# Patient Record
Sex: Female | Born: 1995 | Race: Black or African American | Hispanic: No | Marital: Single | Smoking: Former smoker
Health system: Southern US, Community
[De-identification: ages and names within clinical notes are randomized; demographics above are authoritative.]

## PROBLEM LIST (undated history)

## (undated) DIAGNOSIS — K219 Gastro-esophageal reflux disease without esophagitis: Secondary | ICD-10-CM

## (undated) HISTORY — DX: Gastro-esophageal reflux disease without esophagitis: K21.9

---

## 2018-12-31 ENCOUNTER — Other Ambulatory Visit: Payer: Self-pay

## 2018-12-31 ENCOUNTER — Emergency Department
Admission: EM | Admit: 2018-12-31 | Discharge: 2018-12-31 | Disposition: A | Discharge status: Home / Self Care | Payer: Self-pay | Attending: Emergency Medicine | Admitting: Emergency Medicine

## 2018-12-31 ENCOUNTER — Encounter: Payer: Self-pay | Admitting: Emergency Medicine

## 2018-12-31 DIAGNOSIS — J069 Acute upper respiratory infection, unspecified: Secondary | ICD-10-CM | POA: Insufficient documentation

## 2018-12-31 DIAGNOSIS — B9789 Other viral agents as the cause of diseases classified elsewhere: Secondary | ICD-10-CM | POA: Insufficient documentation

## 2018-12-31 LAB — INFLUENZA PANEL BY PCR (TYPE A & B)
INFLAPCR: NEGATIVE
Influenza B By PCR: NEGATIVE

## 2018-12-31 LAB — GROUP A STREP BY PCR: Group A Strep by PCR: NOT DETECTED

## 2018-12-31 MED ORDER — PROMETHAZINE-DM 6.25-15 MG/5ML PO SYRP: 5.0000 mL | ORAL_SOLUTION | Freq: Four times a day (QID) | ORAL | 0 refills | Status: DC | PRN

## 2018-12-31 MED ORDER — IBUPROFEN 600 MG PO TABS: 600.0000 mg | ORAL_TABLET | Freq: Three times a day (TID) | ORAL | 0 refills | Status: DC | PRN

## 2018-12-31 NOTE — ED Provider Notes (Signed)
[facility] Emergency Department Provider Note   ____________________________________________   First MD Initiated Contact with Patient 12/31/18 1541     (approximate)  I have reviewed the triage vital signs and the nursing notes.   HISTORY  Chief Complaint Sore Throat    HPI Crystal Vincent is a 23 y.o. female patient reports to ED for complaint of sore throat, fever, and body aches for 2 days.  Patient state she had vomiting and diarrhea on day 1.  Patient did increase decrease yesterday and none today.  Patient states is able tolerate food and fluids.  Patient states he has not taken flu shot for this season.  Patient rates the pain as a 6/10.  Patient describes her pain as "sore throat//body aches".  No palliative measure for complaint.    History reviewed. No pertinent past medical history.  There are no active problems to display for this patient.   History reviewed. No pertinent surgical history.  Prior to Admission medications   Medication Sig Start Date End Date Taking? Authorizing Provider  ibuprofen (ADVIL,MOTRIN) 600 MG tablet Take 1 tablet (600 mg total) by mouth every 8 (eight) hours as needed. 12/31/18   Joni Reining, PA-C  promethazine-dextromethorphan (PROMETHAZINE-DM) 6.25-15 MG/5ML syrup Take 5 mLs by mouth 4 (four) times daily as needed for cough. 12/31/18   Joni Reining, PA-C    Allergies Patient has no known allergies.  No family history on file.  Social History Social History   Tobacco Use  . Smoking status: Not on file  Substance Use Topics  . Alcohol use: Not on file  . Drug use: Not on file    Review of Systems Constitutional: Fever/chills and body aches.   Eyes: No visual changes. ENT: Sore throat.   Cardiovascular: Denies chest pain. Respiratory: Denies shortness of breath.  Nonproductive cough. Gastrointestinal: No abdominal pain.  No nausea, no vomiting.  No diarrhea.  No constipation. Genitourinary:  Negative for dysuria. Musculoskeletal: Negative for back pain. Skin: Negative for rash. Neurological: Negative for headaches, focal weakness or numbness.   ____________________________________________   PHYSICAL EXAM:  VITAL SIGNS: ED Triage Vitals  Enc Vitals Group     BP 12/31/18 1530 127/74     Pulse Rate 12/31/18 1530 70     Resp 12/31/18 1530 18     Temp 12/31/18 1530 98.6 F (37 C)     Temp Source 12/31/18 1530 Oral     SpO2 12/31/18 1530 98 %     Weight 12/31/18 1533 280 lb (127 kg)     Height 12/31/18 1533 5\' 4"  (1.626 m)     Head Circumference --      Peak Flow --      Pain Score 12/31/18 1533 6     Pain Loc --      Pain Edu? --      Excl. in GC? --     Constitutional: Alert and oriented. Well appearing and in no acute distress. Nose: Clear rhinorrhea.   Mouth/Throat: Mucous membranes are moist.  Oropharynx non-erythematous.  Postnasal drainage. Neck: No stridor.   Hematological/Lymphatic/Immunilogical: No cervical lymphadenopathy. Cardiovascular: Normal rate, regular rhythm. Grossly normal heart sounds.  Good peripheral circulation. Respiratory: Normal respiratory effort.  No retractions. Lungs CTAB. Skin:  Skin is warm, dry and intact. No rash noted.  ____________________________________________   LABS (all labs ordered are listed, but only abnormal results are displayed)  Labs Reviewed  GROUP A STREP BY PCR  INFLUENZA PANEL BY  PCR (TYPE A & B)   ____________________________________________  EKG   ____________________________________________  RADIOLOGY  ED MD interpretation:    Official radiology report(s): No results found.  ____________________________________________   PROCEDURES  Procedure(s) performed (including Critical Care):  Procedures   ____________________________________________   INITIAL IMPRESSION / ASSESSMENT AND PLAN / ED COURSE  As part of my medical decision making, I reviewed the following data within the  electronic MEDICAL RECORD NUMBER      Patient presents with sore throat and fever for 2 to 3 days.  Patient had not taken flu shot for this season.  Patient strep and flu results were negative.  Patient given discharge care instructions for viral respiratory infection.  Patient advised [facility]-up [facility].         ____________________________________________   FINAL CLINICAL IMPRESSION(S) / ED DIAGNOSES  Final diagnoses:  Viral URI with cough     ED Discharge Orders         Ordered    promethazine-dextromethorphan (PROMETHAZINE-DM) 6.25-15 MG/5ML syrup  4 times daily PRN     12/31/18 1636    ibuprofen (ADVIL,MOTRIN) 600 MG tablet  Every 8 hours PRN     12/31/18 1636           Note:  This document was prepared using Dragon voice recognition software and may include unintentional dictation errors.    Joni Reining, PA-C 12/31/18 1642    Phineas Semen, MD 01/01/19 1339

## 2018-12-31 NOTE — ED Triage Notes (Signed)
Pt in via POV, reports sore throat and fever x 3 days.  Vitals WDL, NAD noted at this time.

## 2020-10-26 ENCOUNTER — Ambulatory Visit
Admission: EM | Admit: 2020-10-26 | Discharge: 2020-10-26 | Disposition: A | Discharge status: Home / Self Care | Payer: BC Managed Care – PPO

## 2020-10-26 DIAGNOSIS — R1011 Right upper quadrant pain: Secondary | ICD-10-CM

## 2020-10-26 NOTE — ED Provider Notes (Signed)
[facility]    CSN: [id number] Arrival date & time: 10/26/20  1213      History   Chief Complaint Chief Complaint  Patient presents with  . Abdominal Pain    [phone]    HPI Crystal Vincent is a 25 y.o. female.   HPI   25 year old female here for evaluation of right upper quadrant abdominal pain.  Patient reports that she has been having pain off and on at random times for the last 3 to 4 months.  Patient states that she cannot determine if eating makes it better or worse.  She does state that it radiates through to her back and is associated with nausea.  Patient denies fever, vomiting, or urinary complaints.    History reviewed. No pertinent past medical history.  There are no problems to display for this patient.   History reviewed. No pertinent surgical history.  OB History   No obstetric history on file.      Home Medications    Prior to Admission medications   Medication Sig Start Date End Date Taking? Authorizing Provider  ibuprofen (ADVIL,MOTRIN) 600 MG tablet Take 1 tablet (600 mg total) by mouth every 8 (eight) hours as needed. 12/31/18   Joni Reining, PA-C  promethazine-dextromethorphan (PROMETHAZINE-DM) 6.25-15 MG/5ML syrup Take 5 mLs by mouth 4 (four) times daily as needed for cough. 12/31/18   Joni Reining, PA-C    Family History Family History  Problem Relation Age of Onset  . Heart disease Mother   . Healthy Father     Social History Social History   Tobacco Use  . Smoking status: Current Every Day Smoker    Packs/day: 0.50    Types: Cigarettes  . Smokeless tobacco: Never Used  Vaping Use  . Vaping Use: Every day  Substance Use Topics  . Alcohol use: Not Currently  . Drug use: Never     Allergies   Patient has no known allergies.   Review of Systems Review of Systems  Constitutional: Negative for activity change, appetite change and fever.  Gastrointestinal: Positive for abdominal pain and nausea.  Negative for vomiting.  Genitourinary: Negative for dysuria, frequency and urgency.  Hematological: Negative.   Psychiatric/Behavioral: Negative.      Physical Exam Triage Vital Signs ED Triage Vitals  Enc Vitals Group     BP 10/26/20 1459 104/60     Pulse Rate 10/26/20 1459 91     Resp 10/26/20 1459 18     Temp 10/26/20 1459 98.2 F (36.8 C)     Temp Source 10/26/20 1459 Oral     SpO2 10/26/20 1459 100 %     Weight 10/26/20 1334 (!) 320 lb (145.2 kg)     Height 10/26/20 1334 5\' 3"  (1.6 m)     Head Circumference --      Peak Flow --      Pain Score 10/26/20 1333 7     Pain Loc --      Pain Edu? --      Excl. in GC? --    No data found.  Updated Vital Signs BP 104/60 (BP Location: Right Arm)   Pulse 91   Temp 98.2 F (36.8 C) (Oral)   Resp 18   Ht 5\' 3"  (1.6 m)   Wt (!) 320 lb (145.2 kg)   SpO2 100%   BMI 56.69 kg/m   Visual Acuity Right Eye Distance:   Left Eye Distance:   Bilateral Distance:  Right Eye Near:   Left Eye Near:    Bilateral Near:     Physical Exam Vitals and nursing note reviewed.  Constitutional:      Appearance: She is well-developed. She is obese.  HENT:     Head: Normocephalic and atraumatic.  Cardiovascular:     Rate and Rhythm: Normal rate and regular rhythm.     Heart sounds: Normal heart sounds. No murmur heard. No gallop.   Pulmonary:     Effort: Pulmonary effort is normal.     Breath sounds: Normal breath sounds. No wheezing, rhonchi or rales.  Abdominal:     General: Abdomen is protuberant. Bowel sounds are normal. There is no distension.     Palpations: Abdomen is soft. There is no hepatomegaly or splenomegaly.     Tenderness: There is abdominal tenderness in the right upper quadrant. There is guarding. Positive signs include Murphy's sign.  Skin:    General: Skin is warm and dry.     Capillary Refill: Capillary refill takes less than 2 seconds.     Findings: No erythema.  Neurological:     General: No focal deficit  present.     Mental Status: She is alert and oriented to person, place, and time.  Psychiatric:        Mood and Affect: Mood normal.        Behavior: Behavior normal.      UC Treatments / Results  Labs (all labs ordered are listed, but only abnormal results are displayed) Labs Reviewed - No data to display  EKG   Radiology No results found.  Procedures Procedures (including critical care time)  Medications Ordered in UC Medications - No data to display  Initial Impression / Assessment and Plan / UC Course  I have reviewed the triage vital signs and the nursing notes.  Pertinent labs & imaging results that were available during my care of the patient were reviewed by me and considered in my medical decision making (see chart for details).   Evaluation of right upper quadrant abdominal pain that is been present for last 3 to 4 months.  Patient states that she is unsure of eating makes it worse.  The pain does radiate through to her back and she does complain of nausea.  Physical exam reveals marked right upper quadrant tenderness with guarding.  Patient also has a positive Murphy's sign on exam.  Patient just ate [employer] before coming to the urgent care and is currently rating her pain as a 6 out of 10 and constant.  Patient's physical exam and presentation is concerning consistent with an worrisome for gallbladder disease.  In the setting of morbid obesity and the fact that she is having constant pain after eating [employer], concerned that she may have acute cholecystitis.  Patient counseled that I am concerned that she does have her gallbladder removed patient to be evaluated in the emergency department.  Patient is in agreement and has elected to go to [facility].   Final Clinical Impressions(s) / UC Diagnoses   Final diagnoses:  RUQ abdominal pain     Discharge Instructions     Please go to the emergency department at [facility] for evaluation of your  gallbladder.    ED Prescriptions    None     PDMP not reviewed this encounter.   Becky Augusta, NP 10/26/20 [phone]

## 2020-10-26 NOTE — ED Triage Notes (Signed)
Patient complains of RUQ pain that started around 3-4 months ago. States that she has been having pain off and on and shows up randomly.

## 2020-10-26 NOTE — Discharge Instructions (Addendum)
Please go to the emergency department at [facility] for evaluation of your gallbladder.

## 2020-11-14 ENCOUNTER — Ambulatory Visit (INDEPENDENT_AMBULATORY_CARE_PROVIDER_SITE_OTHER): Payer: BC Managed Care – PPO

## 2020-11-14 ENCOUNTER — Other Ambulatory Visit: Payer: Self-pay

## 2020-11-14 ENCOUNTER — Ambulatory Visit
Admission: EM | Admit: 2020-11-14 | Discharge: 2020-11-14 | Disposition: A | Discharge status: Home / Self Care | Payer: BC Managed Care – PPO | Attending: Family Medicine | Admitting: Family Medicine

## 2020-11-14 DIAGNOSIS — R058 Other specified cough: Secondary | ICD-10-CM

## 2020-11-14 DIAGNOSIS — F17210 Nicotine dependence, cigarettes, uncomplicated: Secondary | ICD-10-CM | POA: Insufficient documentation

## 2020-11-14 DIAGNOSIS — U071 COVID-19: Secondary | ICD-10-CM | POA: Diagnosis not present

## 2020-11-14 DIAGNOSIS — J988 Other specified respiratory disorders: Secondary | ICD-10-CM | POA: Diagnosis present

## 2020-11-14 DIAGNOSIS — R0981 Nasal congestion: Secondary | ICD-10-CM

## 2020-11-14 MED ORDER — IPRATROPIUM BROMIDE 0.06 % NA SOLN: 2.0000 | Freq: Four times a day (QID) | NASAL | 0 refills | Status: DC | PRN

## 2020-11-14 MED ORDER — AMOXICILLIN-POT CLAVULANATE 875-125 MG PO TABS: 1.0000 | ORAL_TABLET | Freq: Two times a day (BID) | ORAL | 0 refills | Status: DC

## 2020-11-14 MED ORDER — BENZONATATE 200 MG PO CAPS: 200.0000 mg | ORAL_CAPSULE | Freq: Three times a day (TID) | ORAL | 0 refills | Status: DC | PRN

## 2020-11-14 NOTE — Discharge Instructions (Signed)
Medication as prescribed.  Stay home.  Check my chart for COVID test results.  Take care  Dr. Yitta Gongaware   

## 2020-11-14 NOTE — ED Triage Notes (Signed)
Pt presents with c/o productive cough and nasal congestion since last week. She also reports some shob. Pt denies n/v/d/f. Pt has taken Tylenol and nasal spray with some relief. Pt denies any known sick contacts.

## 2020-11-14 NOTE — ED Provider Notes (Signed)
[facility]    CSN: [id number] Arrival date & time: 11/14/20  1359      History   Chief Complaint Chief Complaint  Patient presents with  . Cough  . Nasal Congestion    HPI  25 year old female presents with respiratory symptoms.  Patient reports that she has been sick for over a week.  She reports sore throat, congestion, dizziness, cough.  She states that she initially had fever.  Has not been tested for Covid.  No reported sick contacts.  She has taken over-the-counter medication with some relief but no resolution.  No other complaints or concerns at this time.  Home Medications    Prior to Admission medications   Medication Sig Start Date End Date Taking? Authorizing Provider  amoxicillin-clavulanate (AUGMENTIN) 875-125 MG tablet Take 1 tablet by mouth 2 (two) times daily. 11/14/20  Yes Akasha Melena G, DO  benzonatate (TESSALON) 200 MG capsule Take 1 capsule (200 mg total) by mouth 3 (three) times daily as needed for cough. 11/14/20  Yes Monet North G, DO  ipratropium (ATROVENT) 0.06 % nasal spray Place 2 sprays into both nostrils 4 (four) times daily as needed for rhinitis. 11/14/20  Yes Loel Betancur G, DO  omeprazole (PRILOSEC) 20 MG capsule Take 20 mg by mouth daily. 11/01/20   [provider]    Family History Family History  Problem Relation Age of Onset  . Heart disease Mother   . Healthy Father     Social History Social History   Tobacco Use  . Smoking status: Current Every Day Smoker    Packs/day: 0.50    Types: Cigarettes  . Smokeless tobacco: Never Used  Vaping Use  . Vaping Use: Every day  Substance Use Topics  . Alcohol use: Not Currently  . Drug use: Never     Allergies   Patient has no known allergies.   Review of Systems Review of Systems Per HPI  Physical Exam Triage Vital Signs ED Triage Vitals  Enc Vitals Group     BP 11/14/20 1549 (!) 120/40     Pulse Rate 11/14/20 1549 86     Resp 11/14/20 1549 18     Temp  11/14/20 1549 97.7 F (36.5 C)     Temp Source 11/14/20 1549 Oral     SpO2 11/14/20 1549 99 %     Weight 11/14/20 1545 300 lb (136.1 kg)     Height 11/14/20 1545 5\' 4"  (1.626 m)     Head Circumference --      Peak Flow --      Pain Score 11/14/20 1545 0     Pain Loc --      Pain Edu? --      Excl. in GC? --    Updated Vital Signs BP (!) 120/40 (BP Location: Left Arm)   Pulse 86   Temp 97.7 F (36.5 C) (Oral)   Resp 18   Ht 5\' 4"  (1.626 m)   Wt 136.1 kg   SpO2 99%   BMI 51.49 kg/m   Visual Acuity Right Eye Distance:   Left Eye Distance:   Bilateral Distance:    Right Eye Near:   Left Eye Near:    Bilateral Near:     Physical Exam Vitals and nursing note reviewed.  Constitutional:      General: She is not in acute distress.    Appearance: Normal appearance. She is obese. She is not ill-appearing.  HENT:  Head: Normocephalic and atraumatic.     Nose: Congestion present.     Mouth/Throat:     Pharynx: Oropharynx is clear.  Eyes:     General:        Right eye: No discharge.        Left eye: No discharge.     Conjunctiva/sclera: Conjunctivae normal.  Cardiovascular:     Rate and Rhythm: Normal rate and regular rhythm.     Heart sounds: No murmur heard.   Pulmonary:     Effort: Pulmonary effort is normal.     Breath sounds: Normal breath sounds. No wheezing, rhonchi or rales.  Neurological:     Mental Status: She is alert.  Psychiatric:        Mood and Affect: Mood normal.        Behavior: Behavior normal.    UC Treatments / Results  Labs (all labs ordered are listed, but only abnormal results are displayed) Labs Reviewed  SARS CORONAVIRUS 2 (TAT 6-24 HRS)    EKG   Radiology DG Chest 2 View  Result Date: 11/14/2020 CLINICAL DATA:  Productive cough, nasal congestion EXAM: CHEST - 2 VIEW COMPARISON:  None. FINDINGS: The heart size and mediastinal contours are within normal limits. Both lungs are clear. The visualized skeletal structures are  unremarkable. IMPRESSION: No active cardiopulmonary disease. Electronically Signed   By: Sharlet Salina M.D.   On: 11/14/2020 16:42    Procedures Procedures (including critical care time)  Medications Ordered in UC Medications - No data to display  Initial Impression / Assessment and Plan / UC Course  I have reviewed the triage vital signs and the nursing notes.  Pertinent labs & imaging results that were available during my care of the patient were reviewed by me and considered in my medical decision making (see chart for details).    25 year old female presents with a respiratory infection.  Awaiting Covid test results.  Given the duration of her symptoms, placing on Augmentin.  Tessalon Perles for cough.  Supportive care.  Final Clinical Impressions(s) / UC Diagnoses   Final diagnoses:  Respiratory infection     Discharge Instructions     Medication as prescribed.  Stay home.  Check my chart for COVID test results.  Take care  Dr. Adriana Simas     ED Prescriptions    Medication Sig Dispense Auth. Provider   amoxicillin-clavulanate (AUGMENTIN) 875-125 MG tablet Take 1 tablet by mouth 2 (two) times daily. 20 tablet Loic Hobin G, DO   benzonatate (TESSALON) 200 MG capsule Take 1 capsule (200 mg total) by mouth 3 (three) times daily as needed for cough. 30 capsule Idriss Quackenbush G, DO   ipratropium (ATROVENT) 0.06 % nasal spray Place 2 sprays into both nostrils 4 (four) times daily as needed for rhinitis. 15 mL Tommie Sams, DO     PDMP not reviewed this encounter.   Tommie Sams, [place] 11/14/20 1829

## 2020-11-15 LAB — SARS CORONAVIRUS 2 (TAT 6-24 HRS): SARS Coronavirus 2: POSITIVE — AB

## 2022-02-16 IMAGING — CR DG CHEST 2V
2 series · 3 of 3 positions shown · non-contrast
Comparison: None.

CLINICAL DATA: Productive cough, nasal congestion

EXAM:
CHEST - 2 VIEW

[Series 1: chest pa · 0.14mm/px · 2 of 2 slices shown]
[im 1/2]
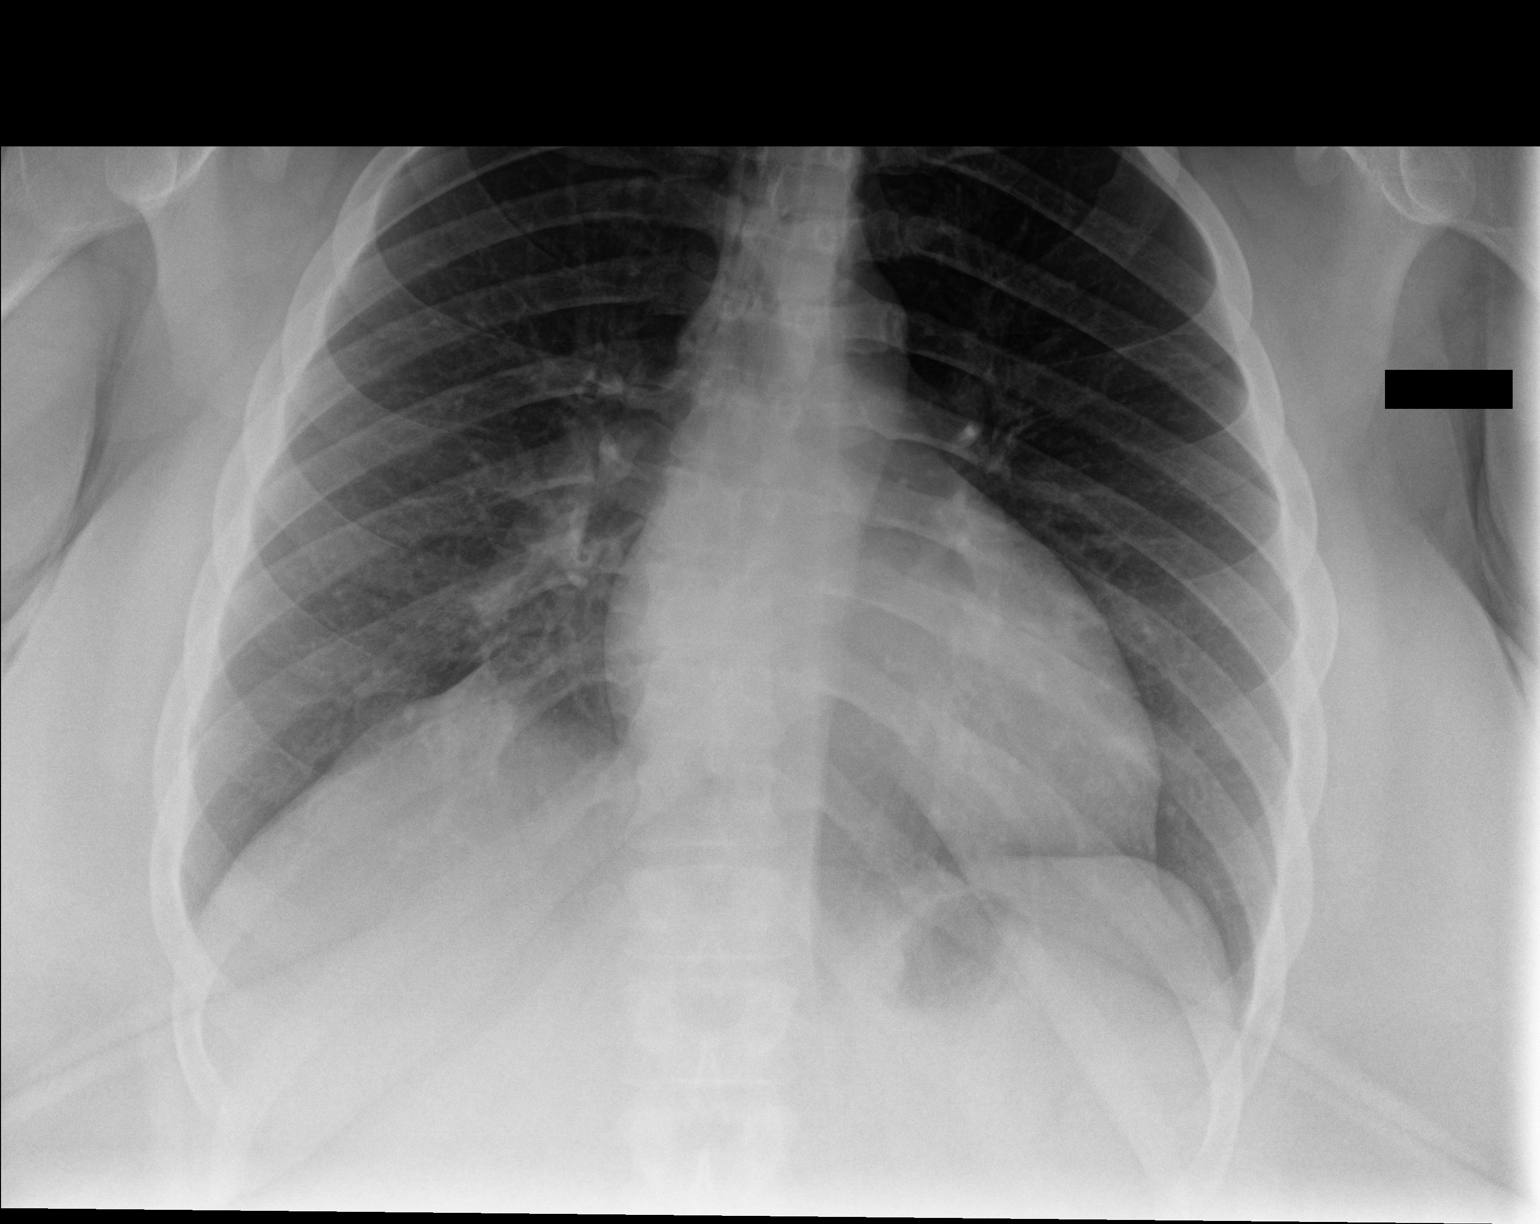
[im 2/2]
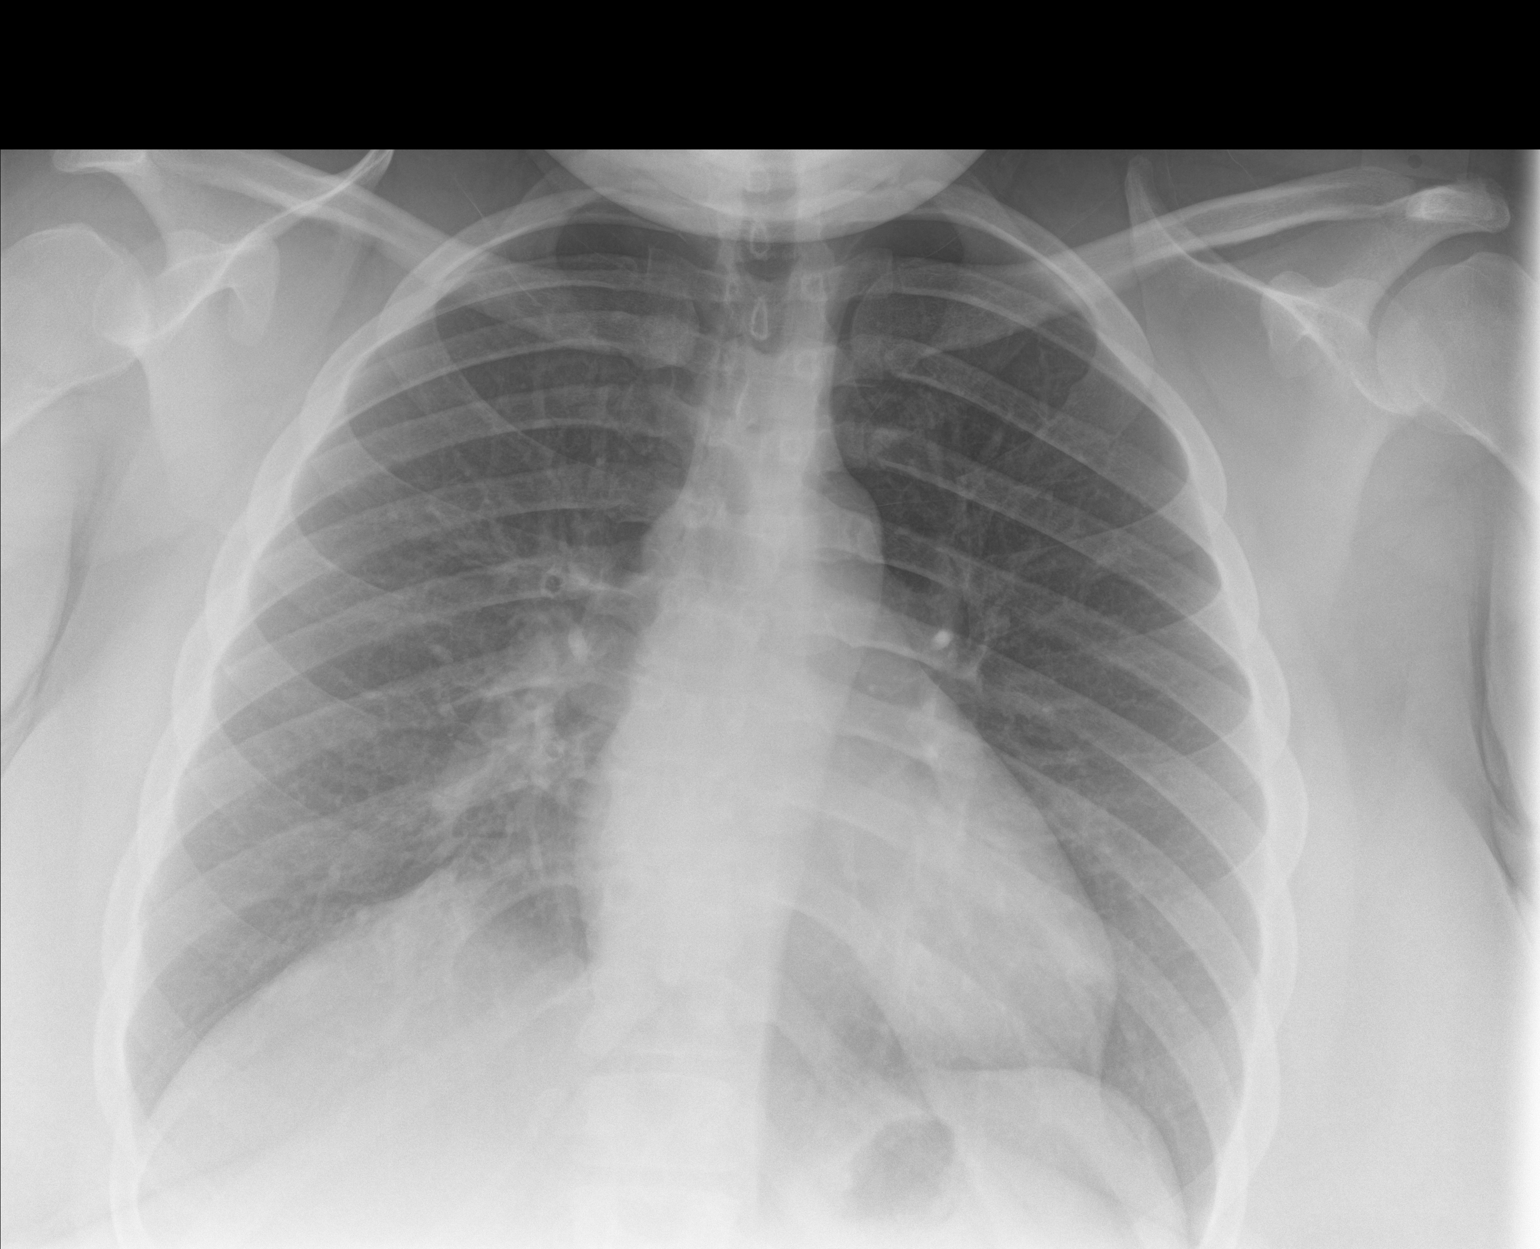

[chest lat]
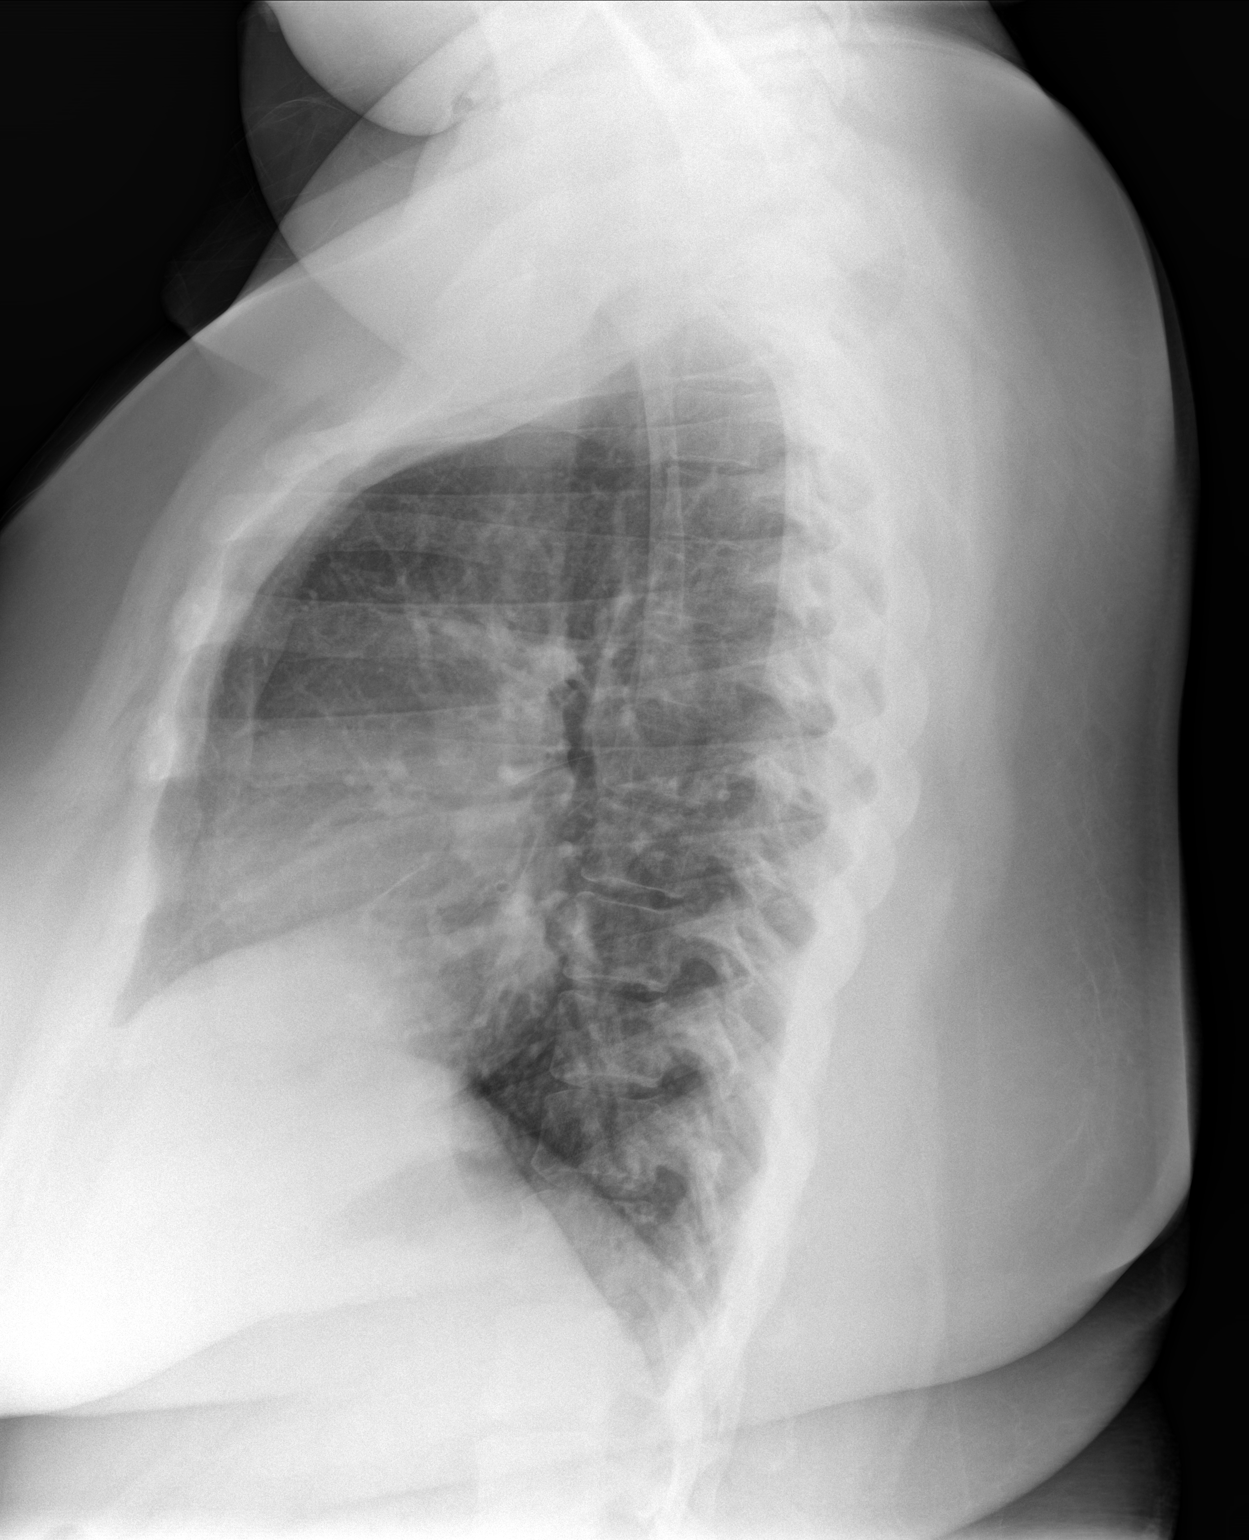

[3 of 3 positions shown; findings below may reference images not displayed]

FINDINGS: The heart size and mediastinal contours are within normal limits.
Both lungs are clear. The visualized skeletal structures are
unremarkable.
IMPRESSION: No active cardiopulmonary disease.

## 2022-08-29 ENCOUNTER — Other Ambulatory Visit: Payer: Self-pay

## 2022-08-29 ENCOUNTER — Emergency Department (HOSPITAL_COMMUNITY)
Admission: EM | Admit: 2022-08-29 | Discharge: 2022-08-29 | Discharge status: Against Medical Advice | Payer: BC Managed Care – PPO | Attending: Emergency Medicine | Admitting: Emergency Medicine

## 2022-08-29 DIAGNOSIS — R109 Unspecified abdominal pain: Secondary | ICD-10-CM | POA: Insufficient documentation

## 2022-08-29 DIAGNOSIS — R112 Nausea with vomiting, unspecified: Secondary | ICD-10-CM | POA: Diagnosis present

## 2022-08-29 DIAGNOSIS — Z5321 Procedure and treatment not carried out due to patient leaving prior to being seen by health care provider: Secondary | ICD-10-CM | POA: Insufficient documentation

## 2022-08-29 NOTE — ED Triage Notes (Signed)
Pt. Stated, I started to have stomach pain with N/V last night.

## 2024-06-17 NOTE — Patient Instructions (Signed)

## 2024-06-18 ENCOUNTER — Ambulatory Visit

## 2024-06-19 ENCOUNTER — Ambulatory Visit (INDEPENDENT_AMBULATORY_CARE_PROVIDER_SITE_OTHER): Admitting: Nurse Practitioner

## 2024-06-19 ENCOUNTER — Encounter: Payer: Self-pay | Admitting: Nurse Practitioner

## 2024-06-19 VITALS — BP 102/66 | HR 58 | Temp 98.7°F | Ht 62.4 in | Wt 337.6 lb

## 2024-06-19 DIAGNOSIS — N926 Irregular menstruation, unspecified: Secondary | ICD-10-CM | POA: Diagnosis not present

## 2024-06-19 DIAGNOSIS — Z1159 Encounter for screening for other viral diseases: Secondary | ICD-10-CM

## 2024-06-19 DIAGNOSIS — Z114 Encounter for screening for human immunodeficiency virus [HIV]: Secondary | ICD-10-CM

## 2024-06-19 DIAGNOSIS — Z136 Encounter for screening for cardiovascular disorders: Secondary | ICD-10-CM

## 2024-06-19 DIAGNOSIS — E66813 Obesity, class 3: Secondary | ICD-10-CM

## 2024-06-19 DIAGNOSIS — F321 Major depressive disorder, single episode, moderate: Secondary | ICD-10-CM | POA: Diagnosis not present

## 2024-06-19 DIAGNOSIS — Z6841 Body Mass Index (BMI) 40.0 and over, adult: Secondary | ICD-10-CM

## 2024-06-19 DIAGNOSIS — Z13220 Encounter for screening for lipoid disorders: Secondary | ICD-10-CM

## 2024-06-19 DIAGNOSIS — Z7689 Persons encountering health services in other specified circumstances: Secondary | ICD-10-CM

## 2024-06-19 DIAGNOSIS — F32A Depression, unspecified: Secondary | ICD-10-CM | POA: Insufficient documentation

## 2024-06-19 DIAGNOSIS — E559 Vitamin D deficiency, unspecified: Secondary | ICD-10-CM

## 2024-06-19 MED ORDER — BUPROPION HCL ER (XL) 150 MG PO TB24: 150.0000 mg | ORAL_TABLET | Freq: Every day | ORAL | 1 refills | Status: DC

## 2024-06-19 NOTE — Progress Notes (Addendum)
 New Patient Office Visit  Subjective    Patient ID: Crystal Vincent, female    DOB: 1996/05/16  Age: 28 y.o. MRN: [id number]  CC:  Chief Complaint  Patient presents with   Obesity    Patient states she would like to discuss weight loss   HPI [name] presents for new patient visit to establish care.  Introduced to Publishing rights manager role and practice setting.  All questions answered.  Discussed provider/patient relationship and expectations. Has never had a primary care provider. Works at dialysis, [employer].  ABNORMAL MENSTRUAL PERIODS No history of pregnancy. Duration: months Average interval between menses: sometimes 6 months to 2 years Length of menses: 2-3 days Flow: light Dysmenorrhea: yes  Intermenstrual bleeding:no Postcoital bleeding: no Contraception: none Menarche at age: 28 years old Sexual activity: Not sexually active History of sexually transmitted diseases: no History GYN procedures: no Abnormal pap smears: has not had one yet   Dyspareunia: no Vaginal discharge:no Abdominal pain: no Galactorrhea: no Hirsuitism: yes Frequent bruising/mucosal bleeding: yes Double vision:no Hot flashes: yes   WEIGHT GAIN Has not taken anything in past for weight loss.  Does work on diet and exercise, has active job and has not budged in weight.  Tries not to eat fast food, but eats 3 times a week.   Duration: chronic Previous attempts at weight loss: yes Complications of obesity: GERD and irregular cycles Peak weight: 350 lbs Weight loss goal: 190 lbs Weight loss to date: 13 lbs Requesting obesity pharmacotherapy: yes Current weight loss supplements/medications: no Previous weight loss supplements/meds: no Calories:  2200 calories or less, when working does not have as much time to eat  DEPRESSION Has been depressed for long long time. Has history of some traumatic event in past.   Mood status: uncontrolled Psychotherapy/counseling: tried in the  past Previous psychiatric medications: none Depressed mood: yes Anxious mood: yes Anhedonia: occasional Significant weight loss or gain: no Insomnia: yes hard to stay asleep Fatigue: yes Feelings of worthlessness or guilt: yes Impaired concentration/indecisiveness: yes Suicidal ideations: no -- occasional fleeting thoughts, but no plans Hopelessness: yes Crying spells: yes    06/19/2024   10:57 AM  Depression screen PHQ 2/9  Decreased Interest 1  Down, Depressed, Hopeless 2  PHQ - 2 Score 3  Altered sleeping 2  Tired, decreased energy 2  Change in appetite 2  Feeling bad or failure about yourself  2  Trouble concentrating 3  Moving slowly or fidgety/restless 0  Suicidal thoughts 1  PHQ-9 Score 15  Difficult doing work/chores Very difficult       06/19/2024   10:57 AM  GAD 7 : Generalized Anxiety Score  Nervous, Anxious, on Edge 2  Control/stop worrying 2  Worry too much - different things 3  Trouble relaxing 2  Restless 1  Easily annoyed or irritable 3  Afraid - awful might happen 1  Total GAD 7 Score 14  Anxiety Difficulty Somewhat difficult   Outpatient Encounter Medications as of 06/19/2024  Medication Sig   buPROPion  (WELLBUTRIN  XL) 150 MG 24 hr tablet Take 1 tablet (150 mg total) by mouth daily.   [DISCONTINUED] amoxicillin  (AMOXIL ) 500 MG capsule Take 500 mg by mouth 3 (three) times daily.   [DISCONTINUED] amoxicillin -clavulanate (AUGMENTIN ) 875-125 MG tablet Take 1 tablet by mouth 2 (two) times daily.   [DISCONTINUED] benzonatate  (TESSALON ) 200 MG capsule Take 1 capsule (200 mg total) by mouth 3 (three) times daily as needed for cough.   [DISCONTINUED] ipratropium (ATROVENT ) 0.06 %  nasal spray Place 2 sprays into both nostrils 4 (four) times daily as needed for rhinitis.   [DISCONTINUED] omeprazole (PRILOSEC) 20 MG capsule Take 20 mg by mouth daily.   No facility-administered encounter medications on file as of 06/19/2024.    Past Medical History:   Diagnosis Date   GERD (gastroesophageal reflux disease)     History reviewed. No pertinent surgical history.  Family History  Problem Relation Age of Onset   Heart disease Mother    Healthy Father     Social History   Socioeconomic History   Marital status: Single    Spouse name: Not on file   Number of children: Not on file   Years of education: Not on file   Highest education level: Not on file  Occupational History   Not on file  Tobacco Use   Smoking status: Former    Current packs/day: 0.50    Types: Cigarettes   Smokeless tobacco: Never  Vaping Use   Vaping status: Former  Substance and Sexual Activity   Alcohol use: Not Currently   Drug use: Never   Sexual activity: Not Currently  Other Topics Concern   Not on file  Social History Narrative   Not on file   Social Drivers of Health   Financial Resource Strain: Low Risk  (06/19/2024)   Overall Financial Resource Strain (CARDIA)    Difficulty of Paying Living Expenses: Not hard at all  Food Insecurity: No Food Insecurity (06/19/2024)   Hunger Vital Sign    Worried About Running Out of Food in the Last Year: Never true    Ran Out of Food in the Last Year: Never true  Transportation Needs: No Transportation Needs (06/19/2024)   PRAPARE - Administrator, Civil Service (Medical): No    Lack of Transportation (Non-Medical): No  Physical Activity: Insufficiently Active (06/19/2024)   Exercise Vital Sign    Days of Exercise per Week: 3 days    Minutes of Exercise per Session: 30 min  Stress: Stress Concern Present (06/19/2024)   [employer] of Occupational Health - Occupational Stress Questionnaire    Feeling of Stress: To some extent  Social Connections: Socially Isolated (06/19/2024)   Social Connection and Isolation Panel    Frequency of Communication with Friends and Family: Three times a week    Frequency of Social Gatherings with Friends and Family: Three times a week    Attends Religious  Services: Never    Active Member of Clubs or Organizations: No    Attends Banker Meetings: Never    Marital Status: Never married  Intimate Partner Violence: Not At Risk (06/19/2024)   Humiliation, Afraid, Rape, and Kick questionnaire    Fear of Current or Ex-Partner: No    Emotionally Abused: No    Physically Abused: No    Sexually Abused: No    Review of Systems  Constitutional:  Positive for malaise/fatigue. Negative for diaphoresis, fever and weight loss.  Respiratory:  Negative for cough, shortness of breath and wheezing.   Cardiovascular:  Negative for chest pain, palpitations and leg swelling.  Gastrointestinal: Negative.   Neurological: Negative.   Endo/Heme/Allergies:  Negative for polydipsia. Bruises/bleeds easily.  Psychiatric/Behavioral:  Positive for depression. Negative for memory loss, substance abuse and suicidal ideas (no plans, occasional fleeting thoughts). The patient is nervous/anxious and has insomnia.        Objective    BP 102/66   Pulse (!) 58   Temp 98.7 F (37.1  C) (Oral)   Ht 5' 2.4 (1.585 m)   Wt (!) 337 lb 9.6 oz (153.1 kg)   SpO2 98%   BMI 60.96 kg/m   Physical Exam Vitals and nursing note reviewed.  Constitutional:      General: She is awake. She is not in acute distress.    Appearance: She is well-developed and well-groomed. She is obese. She is not ill-appearing or toxic-appearing.  HENT:     Head: Normocephalic.     Right Ear: Hearing and external ear normal.     Left Ear: Hearing and external ear normal.  Eyes:     General: Lids are normal.        Right eye: No discharge.        Left eye: No discharge.     Conjunctiva/sclera: Conjunctivae normal.     Pupils: Pupils are equal, round, and reactive to light.  Neck:     Thyroid: No thyromegaly.     Vascular: No carotid bruit.  Cardiovascular:     Rate and Rhythm: Regular rhythm. Bradycardia present.     Heart sounds: Normal heart sounds. No murmur heard.    No  gallop.  Pulmonary:     Effort: Pulmonary effort is normal. No accessory muscle usage or respiratory distress.     Breath sounds: Normal breath sounds.  Abdominal:     General: Bowel sounds are normal. There is no distension.     Palpations: Abdomen is soft.     Tenderness: There is no abdominal tenderness.  Musculoskeletal:     Cervical back: Normal range of motion and neck supple.     Right lower leg: No edema.     Left lower leg: No edema.  Lymphadenopathy:     Cervical: No cervical adenopathy.  Skin:    General: Skin is warm and dry.  Neurological:     Mental Status: She is alert and oriented to person, place, and time.     Deep Tendon Reflexes: Reflexes are normal and symmetric.     Reflex Scores:      Brachioradialis reflexes are 2+ on the right side and 2+ on the left side.      Patellar reflexes are 2+ on the right side and 2+ on the left side. Psychiatric:        Attention and Perception: Attention normal.        Mood and Affect: Mood normal.        Speech: Speech normal.        Behavior: Behavior normal. Behavior is cooperative.        Thought Content: Thought content normal.       Assessment & Plan:   Problem List Items Addressed This Visit       Other   Irregular menstrual bleeding   Ongoing for years, irregular in interval between cycles and cycles are lighter. Check labs today: CBC, CMP, TSH, iron, ferritin. ?PCOS.  May benefit vaginal ultrasound in future, discussed with her today.      Relevant Orders   Ferritin   Iron   CBC with Differential/Platelet   Comprehensive metabolic panel with GFR   TSH   Insulin, random   Depression - Primary   Chronic, ongoing for some time.  Denies SI/HI.  Has occasional fleeting thoughts, but no plan and has a safety plan in place. Lives with her sisters. Educated her on options for treatment.  Will trial Wellbutrin  XL 150 MG daily which may benefit anhedonia and depressive mood +  does not have side effect of weight  gain, as she does not want to gain weight.  Educated her on medication and side effects + BLACK BOX warning.  She is aware if any suicidal ideation or plan presents to immediately stop medication and go to ER or alert PCP.      Relevant Medications   buPROPion  (WELLBUTRIN  XL) 150 MG 24 hr tablet   Class 3 severe obesity due to excess calories without serious comorbidity with body mass index (BMI) of 60.0 to 69.9 in adult   BMI 60.96, has tried losing weight for >6 months and been unable to with healthy diet changes and regular activity.  Has obesity in family, her sister had success from surgery in past.  She would like referral to weight management, placed to [facility]  Recommended eating smaller high protein, low fat meals more frequently and exercising 30 mins a day 5 times a week with a goal of 10-15lb weight loss in the next 3 months. Patient voiced their understanding and motivation to adhere to these recommendations.       Relevant Orders   Amb Ref to Medical Weight Management   BMI 60.0-69.9, adult (HCC)   Refer to obesity plan of care.      Relevant Orders   Amb Ref to Medical Weight Management   Other Visit Diagnoses       Vitamin D deficiency       History of low levels reported, check today and intiate supplement as needed.   Relevant Orders   VITAMIN D 25 Hydroxy (Vit-D Deficiency, Fractures)     Encounter for screening for HIV       HIV screening today, educated patient on this.   Relevant Orders   Hepatitis C antibody     Need for hepatitis C screening test       Hep C screening today, educated patient on this.   Relevant Orders   HIV Antibody (routine testing w rflx)     Encounter for lipid screening for cardiovascular disease       Lipid panel on labs today.   Relevant Orders   Lipid Panel w/o Chol/HDL Ratio     Encounter to establish care       Introduced to provider and clinic setting.       Return in about 6 weeks (around 07/31/2024) for Depression, ANXIETY  -- started Wellbutrin .   Adynn Caseres T Denae Zulueta, NP

## 2024-06-19 NOTE — Assessment & Plan Note (Signed)
 Refer to obesity plan of care. ?

## 2024-06-19 NOTE — Assessment & Plan Note (Signed)
 Chronic, ongoing for some time.  Denies SI/HI.  Has occasional fleeting thoughts, but no plan and has a safety plan in place. Lives with her sisters. Educated her on options for treatment.  Will trial Wellbutrin  XL 150 MG daily which may benefit anhedonia and depressive mood + does not have side effect of weight gain, as she does not want to gain weight.  Educated her on medication and side effects + BLACK BOX warning.  She is aware if any suicidal ideation or plan presents to immediately stop medication and go to ER or alert PCP.

## 2024-06-19 NOTE — Assessment & Plan Note (Signed)
 Ongoing for years, irregular in interval between cycles and cycles are lighter. Check labs today: CBC, CMP, TSH, iron, ferritin. ?PCOS.  May benefit vaginal ultrasound in future, discussed with her today.

## 2024-06-19 NOTE — Assessment & Plan Note (Signed)
 BMI 60.96, has tried losing weight for >6 months and been unable to with healthy diet changes and regular activity.  Has obesity in family, her sister had success from surgery in past.  She would like referral to weight management, placed to [facility].  Recommended eating smaller high protein, low fat meals more frequently and exercising 30 mins a day 5 times a week with a goal of 10-15lb weight loss in the next 3 months. Patient voiced their understanding and motivation to adhere to these recommendations.

## 2024-06-20 LAB — COMPREHENSIVE METABOLIC PANEL WITH GFR
ALT: 23 IU/L (ref 0–32)
AST: 12 IU/L (ref 0–40)
Albumin: 4.2 g/dL (ref 4.0–5.0)
Alkaline Phosphatase: 81 IU/L (ref 44–121)
BUN/Creatinine Ratio: 16 (ref 9–23)
BUN: 10 mg/dL (ref 6–20)
Bilirubin Total: 0.3 mg/dL (ref 0.0–1.2)
CO2: 22 mmol/L (ref 20–29)
Calcium: 9.8 mg/dL (ref 8.7–10.2)
Chloride: 103 mmol/L (ref 96–106)
Creatinine, Ser: 0.62 mg/dL (ref 0.57–1.00)
Globulin, Total: 3.4 g/dL (ref 1.5–4.5)
Glucose: 77 mg/dL (ref 70–99)
Potassium: 4.3 mmol/L (ref 3.5–5.2)
Sodium: 139 mmol/L (ref 134–144)
Total Protein: 7.6 g/dL (ref 6.0–8.5)
eGFR: 126 mL/min/1.73 (ref >59)

## 2024-06-20 LAB — CBC WITH DIFFERENTIAL/PLATELET
Basophils Absolute: 0.1 10*3/uL (ref 0.0–0.2)
Basos: 1 %
EOS (ABSOLUTE): 0.2 10*3/uL (ref 0.0–0.4)
Eos: 2 %
Hematocrit: 41.1 % (ref 34.0–46.6)
Hemoglobin: 12.7 g/dL (ref 11.1–15.9)
Immature Grans (Abs): 0 10*3/uL (ref 0.0–0.1)
Immature Granulocytes: 0 %
Lymphocytes Absolute: 2.4 10*3/uL (ref 0.7–3.1)
Lymphs: 27 %
MCH: 24.9 pg — ABNORMAL LOW (ref 26.6–33.0)
MCHC: 30.9 g/dL — ABNORMAL LOW (ref 31.5–35.7)
MCV: 80 fL (ref 79–97)
Monocytes Absolute: 0.7 10*3/uL (ref 0.1–0.9)
Monocytes: 8 %
Neutrophils Absolute: 5.7 10*3/uL (ref 1.4–7.0)
Neutrophils: 62 %
Platelets: 410 10*3/uL (ref 150–450)
RBC: 5.11 x10E6/uL (ref 3.77–5.28)
RDW: 15.6 % — ABNORMAL HIGH (ref 11.7–15.4)
WBC: 9 10*3/uL (ref 3.4–10.8)

## 2024-06-20 LAB — LIPID PANEL W/O CHOL/HDL RATIO
Cholesterol, Total: 160 mg/dL (ref 100–199)
HDL: 60 mg/dL (ref >39)
LDL Chol Calc (NIH): 86 mg/dL (ref 0–99)
Triglycerides: 70 mg/dL (ref 0–149)
VLDL Cholesterol Cal: 14 mg/dL (ref 5–40)

## 2024-06-20 LAB — HEPATITIS C ANTIBODY: Hep C Virus Ab: NONREACTIVE

## 2024-06-20 LAB — IRON: Iron: 24 ug/dL — ABNORMAL LOW (ref 27–159)

## 2024-06-20 LAB — INSULIN, RANDOM: INSULIN: 22.4 u[IU]/mL (ref 2.6–24.9)

## 2024-06-20 LAB — TSH: TSH: 1.91 u[IU]/mL (ref 0.450–4.500)

## 2024-06-20 LAB — VITAMIN D 25 HYDROXY (VIT D DEFICIENCY, FRACTURES): Vit D, 25-Hydroxy: 16.4 ng/mL — ABNORMAL LOW (ref 30.0–100.0)

## 2024-06-20 LAB — FERRITIN: Ferritin: 18 ng/mL (ref 15–150)

## 2024-06-20 LAB — HIV ANTIBODY (ROUTINE TESTING W REFLEX): HIV Screen 4th Generation wRfx: NONREACTIVE

## 2024-06-21 ENCOUNTER — Ambulatory Visit: Payer: Self-pay | Admitting: Nurse Practitioner

## 2024-06-21 DIAGNOSIS — D508 Other iron deficiency anemias: Secondary | ICD-10-CM

## 2024-06-21 DIAGNOSIS — E559 Vitamin D deficiency, unspecified: Secondary | ICD-10-CM | POA: Insufficient documentation

## 2024-06-21 DIAGNOSIS — D509 Iron deficiency anemia, unspecified: Secondary | ICD-10-CM | POA: Insufficient documentation

## 2024-06-21 MED ORDER — IRON-VITAMIN C 65-125 MG PO TABS: 1.0000 | ORAL_TABLET | Freq: Every day | ORAL | 2 refills | Status: DC

## 2024-06-21 MED ORDER — VITAMIN D3 50 MCG (2000 UT) PO CAPS: 2000.0000 [IU] | ORAL_CAPSULE | Freq: Every day | ORAL | 2 refills | Status: AC

## 2024-06-21 NOTE — Progress Notes (Signed)
 Contacted via MyChart  Good morning Crystal Vincent, your blood work has returned: - Iron  level is low and ferritin on lower side of normal. CBC is showing a normal hemoglobin and hematocrit. I suspect you have some iron  deficiency anemia present though.  So am going to send in an iron  supplement for you to start taking daily and then recommend we recheck levels in 8 weeks. - Vitamin D  level is a little low.  I am going to send in a daily supplement for you to start taking for this to help bone health and overall health. - Remainder of labs are stable. Any questions? Keep being amazing!!  Thank you for allowing me to participate in your care.  I appreciate you. Kindest regards, Eiko Mcgowen

## 2024-07-25 NOTE — Patient Instructions (Signed)
 Be Involved in Caring For Your Health:  Taking Medications When medications are taken as directed, they can greatly improve your health. But if they are not taken as prescribed, they may not work. In some cases, not taking them correctly can be harmful. To help ensure your treatment remains effective and safe, understand your medications and how to take them. Bring your medications to each visit for review by your provider.  Your lab results, notes, and after visit summary will be available on My Chart. We strongly encourage you to use this feature. If lab results are abnormal the clinic will contact you with the appropriate steps. If the clinic does not contact you assume the results are satisfactory. You can always view your results on My Chart. If you have questions regarding your health or results, please contact the clinic during office hours. You can also ask questions on My Chart.  We at [facility] are grateful that you chose us  to provide your care. We strive to provide evidence-based and compassionate care and are always looking for feedback. If you get a survey from the clinic please complete this so we can hear your opinions.  Managing Depression, Adult Depression is a mental health condition that affects your thoughts, feelings, and actions. Being diagnosed with depression can bring you relief if you did not know why you have felt or behaved a certain way. It could also leave you feeling overwhelmed. Finding ways to manage your symptoms can help you feel more positive about your future. How to manage lifestyle changes Being depressed is difficult. Depression can increase the level of everyday stress. Stress can make depression symptoms worse. You may believe your symptoms cannot be managed or will never improve. However, there are many things you can try to help manage your symptoms. There is hope. Managing stress  Stress is your body's reaction to life changes and events,  both good and bad. Stress can add to your feelings of depression. Learning to manage your stress can help lessen your feelings of depression. Try some of the following approaches to reducing your stress (stress reduction techniques): Listen to music that you enjoy and that inspires you. Try using a meditation app or take a meditation class. Develop a practice that helps you connect with your spiritual self. Walk in nature, pray, or go to a place of worship. Practice deep breathing. To do this, inhale slowly through your nose. Pause at the top of your inhale for a few seconds and then exhale slowly, letting yourself relax. Repeat this three or four times. Practice yoga to help relax and work your muscles. Choose a stress reduction technique that works for you. These techniques take time and practice to develop. Set aside 5-15 minutes a day to do them. Therapists can offer training in these techniques. Do these things to help manage stress: Keep a journal. Know your limits. Set healthy boundaries for yourself and others, such as saying no when you think something is too much. Pay attention to how you react to certain situations. You may not be able to control everything, but you can change your reaction. Add humor to your life by watching funny movies or shows. Make time for activities that you enjoy and that relax you. Spend less time using electronics, especially at night before bed. The light from screens can make your brain think it is time to get up rather than go to bed.  Medicines Medicines, such as antidepressants, are often a part of  treatment for depression. Talk with your pharmacist or health care provider about all the medicines, supplements, and herbal products that you take, their possible side effects, and what medicines and other products are safe to take together. Make sure to report any side effects you may have to your health care provider. Relationships Your health care  provider may suggest family therapy, couples therapy, or individual therapy as part of your treatment. How to recognize changes Everyone responds differently to treatment for depression. As you recover from depression, you may start to: Have more interest in doing activities. Feel more hopeful. Have more energy. Eat a more regular amount of food. Have better mental focus. It is important to recognize if your depression is not getting better or is getting worse. The symptoms you had in the beginning may return, such as: Feeling tired. Eating too much or too little. Sleeping too much or too little. Feeling restless, agitated, or hopeless. Trouble focusing or making decisions. Having unexplained aches and pains. Feeling irritable, angry, or aggressive. If you or your family members notice these symptoms coming back, let your health care provider know right away. Follow these instructions at home: Activity Try to get some form of exercise each day, such as walking. Try yoga, mindfulness, or other stress reduction techniques. Participate in group activities if you are able. Lifestyle Get enough sleep. Cut down on or stop using caffeine, tobacco, alcohol, and any other harmful substances. Eat a healthy diet that includes plenty of vegetables, fruits, whole grains, low-fat dairy products, and lean protein. Limit foods that are high in solid fats, added sugar, or salt (sodium). General instructions Take over-the-counter and prescription medicines only as told by your health care provider. Keep all follow-up visits. It is important for your health care provider to check on your mood, behavior, and medicines. Your health care provider may need to make changes to your treatment. Where to find support Talking to others  Friends and family members can be sources of support and guidance. Talk to trusted friends or family members about your condition. Explain your symptoms and let them know that you  are working with a health care provider to treat your depression. Tell friends and family how they can help. Finances Find mental health providers that fit with your financial situation. Talk with your health care provider if you are worried about access to food, housing, or medicine. Call your insurance company to learn about your co-pays and prescription plan. Where to find more information You can find support in your area from: Anxiety and Depression Association of America (ADAA): adaa.org Mental Health America: mentalhealthamerica.net [employer] on Mental Illness: nami.org Contact a health care provider if: You stop taking your antidepressant medicines, and you have any of these symptoms: Nausea. Headache. Light-headedness. Chills and body aches. Not being able to sleep (insomnia). You or your friends and family think your depression is getting worse. Get help right away if: You have thoughts of hurting yourself or others. Get help right away if you feel like you may hurt yourself or others, or have thoughts about taking your own life. Go to your nearest emergency room or: Call 911. Call the National Suicide Prevention Lifeline at (743) 630-7432 or 988. This is open 24 hours a day. Text the Crisis Text Line at 854-590-3828. This information is not intended to replace advice given to you by your health care provider. Make sure you discuss any questions you have with your health care provider. Document Revised: 02/13/2022 Document Reviewed:  02/13/2022 Elsevier Patient Education  2024 [employer].

## 2024-07-31 ENCOUNTER — Encounter: Payer: Self-pay | Admitting: Nurse Practitioner

## 2024-07-31 ENCOUNTER — Ambulatory Visit (INDEPENDENT_AMBULATORY_CARE_PROVIDER_SITE_OTHER): Admitting: Nurse Practitioner

## 2024-07-31 VITALS — BP 114/69 | HR 64 | Temp 98.2°F | Ht 62.4 in | Wt 324.0 lb

## 2024-07-31 DIAGNOSIS — F411 Generalized anxiety disorder: Secondary | ICD-10-CM | POA: Diagnosis not present

## 2024-07-31 DIAGNOSIS — E66813 Obesity, class 3: Secondary | ICD-10-CM | POA: Diagnosis not present

## 2024-07-31 DIAGNOSIS — E559 Vitamin D deficiency, unspecified: Secondary | ICD-10-CM

## 2024-07-31 DIAGNOSIS — Z6841 Body Mass Index (BMI) 40.0 and over, adult: Secondary | ICD-10-CM | POA: Diagnosis not present

## 2024-07-31 DIAGNOSIS — D508 Other iron deficiency anemias: Secondary | ICD-10-CM

## 2024-07-31 DIAGNOSIS — Z23 Encounter for immunization: Secondary | ICD-10-CM

## 2024-07-31 DIAGNOSIS — F321 Major depressive disorder, single episode, moderate: Secondary | ICD-10-CM | POA: Diagnosis not present

## 2024-07-31 MED ORDER — BUPROPION HCL ER (XL) 300 MG PO TB24: 300.0000 mg | ORAL_TABLET | Freq: Every day | ORAL | 4 refills | Status: AC

## 2024-07-31 MED ORDER — HYDROXYZINE PAMOATE 25 MG PO CAPS: 25.0000 mg | ORAL_CAPSULE | Freq: Three times a day (TID) | ORAL | 0 refills | Status: AC | PRN

## 2024-07-31 MED ORDER — BUSPIRONE HCL 5 MG PO TABS: 5.0000 mg | ORAL_TABLET | Freq: Two times a day (BID) | ORAL | 1 refills | Status: AC

## 2024-07-31 NOTE — Assessment & Plan Note (Signed)
 Refer to depression plan of care.

## 2024-07-31 NOTE — Assessment & Plan Note (Signed)
 BMI 58.50, has lost 13 lbs with Wellbutrin . Has tried losing weight for >6 months and been unable to with healthy diet changes and regular activity.  Has obesity in family, her sister had success from surgery in past. She has not heard from [facility] weight management, referral placed last visit.  Will check on this. Recommended eating smaller high protein, low fat meals more frequently and exercising 30 mins a day 5 times a week with a goal of 10-15lb weight loss in the next 3 months. Patient voiced their understanding and motivation to adhere to these recommendations.

## 2024-07-31 NOTE — Assessment & Plan Note (Signed)
 Noted on initial labs and has significant family history of the same. Recently started supplement.  Will recheck labs next visit.

## 2024-07-31 NOTE — Progress Notes (Signed)
 BP 114/69   Pulse 64   Temp 98.2 F (36.8 C) (Oral)   Ht 5' 2.4 (1.585 m)   Wt (!) 324 lb (147 kg)   SpO2 98%   BMI 58.50 kg/m    Subjective:    Patient ID: Crystal Vincent, female    DOB: 1996-03-19, 28 y.o.   MRN: [id number]  HPI: Crystal Vincent is a 28 y.o. female  Chief Complaint  Patient presents with   Anxiety   Depression   ANEMIA Noted on labs weeks back and taking supplement. Family history of this. Anemia status: uncontrolled Etiology of anemia: heavier menstrual cycles Duration of anemia treatment: new to treatment Compliance with treatment: good compliance Iron  supplementation side effects: no Severity of anemia: mild Fatigue:improving Decreased exercise tolerance: no  Dyspnea on exertion: no Palpitations: no Bleeding: no Pica: yes   DEPRESSION/ANXIETY Started on Wellbutrin  on 06/19/24. Has noticed her mind not racing as much as she used to. Anxiety is still elevated.  Able to function more with Wellbutrin , able to do more things. Has lost 13 pounds. Mood status: uncontrolled Satisfied with current treatment?: yes Symptom severity: moderate  Duration of current treatment : chronic Side effects: no Medication compliance: good compliance Psychotherapy/counseling: not current Depressed mood: improving some Anxious mood: yes Anhedonia: a little better Significant weight loss or gain: no Insomnia: varies Fatigue: no Feelings of worthlessness or guilt: improved some Impaired concentration/indecisiveness: improved some Suicidal ideations: no Hopelessness: no Crying spells: one time    07/31/2024    9:41 AM 06/19/2024   10:57 AM  Depression screen PHQ 2/9  Decreased Interest 1 1  Down, Depressed, Hopeless 1 2  PHQ - 2 Score 2 3  Altered sleeping 1 2  Tired, decreased energy 2 2  Change in appetite 2 2  Feeling bad or failure about yourself  2 2  Trouble concentrating 3 3  Moving slowly or fidgety/restless 2 0  Suicidal thoughts 1 1   PHQ-9 Score 15 15  Difficult doing work/chores Somewhat difficult Very difficult       07/31/2024    9:42 AM 06/19/2024   10:57 AM  GAD 7 : Generalized Anxiety Score  Nervous, Anxious, on Edge 3 2  Control/stop worrying 2 2  Worry too much - different things 2 3  Trouble relaxing 2 2  Restless 2 1  Easily annoyed or irritable 2 3  Afraid - awful might happen 3 1  Total GAD 7 Score 16 14  Anxiety Difficulty Very difficult Somewhat difficult   Relevant past medical, surgical, family and social history reviewed and updated as indicated. Interim medical history since our last visit reviewed. Allergies and medications reviewed and updated.  Review of Systems  Constitutional:  Negative for activity change, appetite change, diaphoresis, fatigue and fever.  Respiratory:  Negative for cough, chest tightness and shortness of breath.   Cardiovascular:  Negative for chest pain, palpitations and leg swelling.  Gastrointestinal: Negative.   Neurological: Negative.   Psychiatric/Behavioral:  Negative for decreased concentration, self-injury, sleep disturbance and suicidal ideas. The patient is nervous/anxious.     Per HPI unless specifically indicated above     Objective:    BP 114/69   Pulse 64   Temp 98.2 F (36.8 C) (Oral)   Ht 5' 2.4 (1.585 m)   Wt (!) 324 lb (147 kg)   SpO2 98%   BMI 58.50 kg/m   Wt Readings from Last 3 Encounters:  07/31/24 (!) 324 lb (147 kg)  06/19/24 ROLLEN)  337 lb 9.6 oz (153.1 kg)  08/29/22 300 lb (136.1 kg)    Physical Exam Vitals and nursing note reviewed.  Constitutional:      General: She is awake. She is not in acute distress.    Appearance: She is well-developed and well-groomed. She is obese. She is not ill-appearing or toxic-appearing.  HENT:     Head: Normocephalic.     Right Ear: Hearing and external ear normal.     Left Ear: Hearing and external ear normal.  Eyes:     General: Lids are normal.        Right eye: No discharge.         Left eye: No discharge.     Conjunctiva/sclera: Conjunctivae normal.     Pupils: Pupils are equal, round, and reactive to light.  Neck:     Thyroid: No thyromegaly.     Vascular: No carotid bruit.  Cardiovascular:     Rate and Rhythm: Normal rate and regular rhythm.     Heart sounds: Normal heart sounds. No murmur heard.    No gallop.  Pulmonary:     Effort: Pulmonary effort is normal. No accessory muscle usage or respiratory distress.     Breath sounds: Normal breath sounds.  Abdominal:     General: Bowel sounds are normal. There is no distension.     Palpations: Abdomen is soft.     Tenderness: There is no abdominal tenderness.  Musculoskeletal:     Cervical back: Normal range of motion and neck supple.     Right lower leg: No edema.     Left lower leg: No edema.  Lymphadenopathy:     Cervical: No cervical adenopathy.  Skin:    General: Skin is warm and dry.  Neurological:     Mental Status: She is alert and oriented to person, place, and time.     Deep Tendon Reflexes: Reflexes are normal and symmetric.     Reflex Scores:      Brachioradialis reflexes are 2+ on the right side and 2+ on the left side.      Patellar reflexes are 2+ on the right side and 2+ on the left side. Psychiatric:        Attention and Perception: Attention normal.        Mood and Affect: Mood normal.        Speech: Speech normal.        Behavior: Behavior normal. Behavior is cooperative.        Thought Content: Thought content normal.    Results for orders placed or performed in visit on 06/19/24  Ferritin   Collection Time: 06/19/24 11:45 AM  Result Value Ref Range   Ferritin 18 15 - 150 ng/mL  Iron    Collection Time: 06/19/24 11:45 AM  Result Value Ref Range   Iron  24 (L) 27 - 159 ug/dL  CBC with Differential/Platelet   Collection Time: 06/19/24 11:45 AM  Result Value Ref Range   WBC 9.0 3.4 - 10.8 x10E3/uL   RBC 5.11 3.77 - 5.28 x10E6/uL   Hemoglobin 12.7 11.1 - 15.9 g/dL   Hematocrit  58.8 65.9 - 46.6 %   MCV 80 79 - 97 fL   MCH 24.9 (L) 26.6 - 33.0 pg   MCHC 30.9 (L) 31.5 - 35.7 g/dL   RDW 84.3 (H) 88.2 - 84.5 %   Platelets 410 150 - 450 x10E3/uL   Neutrophils 62 Not Estab. %   Lymphs 27 Not Estab. %  Monocytes 8 Not Estab. %   Eos 2 Not Estab. %   Basos 1 Not Estab. %   Neutrophils Absolute 5.7 1.4 - 7.0 x10E3/uL   Lymphocytes Absolute 2.4 0.7 - 3.1 x10E3/uL   Monocytes Absolute 0.7 0.1 - 0.9 x10E3/uL   EOS (ABSOLUTE) 0.2 0.0 - 0.4 x10E3/uL   Basophils Absolute 0.1 0.0 - 0.2 x10E3/uL   Immature Granulocytes 0 Not Estab. %   Immature Grans (Abs) 0.0 0.0 - 0.1 x10E3/uL  Comprehensive metabolic panel with GFR   Collection Time: 06/19/24 11:45 AM  Result Value Ref Range   Glucose 77 70 - 99 mg/dL   BUN 10 6 - 20 mg/dL   Creatinine, Ser 9.37 0.57 - 1.00 mg/dL   eGFR 873 >40 fO/fpw/8.26   BUN/Creatinine Ratio 16 9 - 23   Sodium 139 134 - 144 mmol/L   Potassium 4.3 3.5 - 5.2 mmol/L   Chloride 103 96 - 106 mmol/L   CO2 22 20 - 29 mmol/L   Calcium 9.8 8.7 - 10.2 mg/dL   Total Protein 7.6 6.0 - 8.5 g/dL   Albumin 4.2 4.0 - 5.0 g/dL   Globulin, Total 3.4 1.5 - 4.5 g/dL   Bilirubin Total 0.3 0.0 - 1.2 mg/dL   Alkaline Phosphatase 81 44 - 121 IU/L   AST 12 0 - 40 IU/L   ALT 23 0 - 32 IU/L  TSH   Collection Time: 06/19/24 11:45 AM  Result Value Ref Range   TSH 1.910 0.450 - 4.500 uIU/mL  Lipid Panel w/o Chol/HDL Ratio   Collection Time: 06/19/24 11:45 AM  Result Value Ref Range   Cholesterol, Total 160 100 - 199 mg/dL   Triglycerides 70 0 - 149 mg/dL   HDL 60 >60 mg/dL   VLDL Cholesterol Cal 14 5 - 40 mg/dL   LDL Chol Calc (NIH) 86 0 - 99 mg/dL  VITAMIN D  25 Hydroxy (Vit-D Deficiency, Fractures)   Collection Time: 06/19/24 11:45 AM  Result Value Ref Range   Vit D, 25-Hydroxy 16.4 (L) 30.0 - 100.0 ng/mL  Hepatitis C antibody   Collection Time: 06/19/24 11:45 AM  Result Value Ref Range   Hep C Virus Ab Non Reactive Non Reactive  HIV Antibody (routine  testing w rflx)   Collection Time: 06/19/24 11:45 AM  Result Value Ref Range   HIV Screen 4th Generation wRfx Non Reactive Non Reactive  Insulin , random   Collection Time: 06/19/24 11:45 AM  Result Value Ref Range   INSULIN  22.4 2.6 - 24.9 uIU/mL      Assessment & Plan:   Problem List Items Addressed This Visit       Other   Iron  deficiency anemia   Noted on initial labs and has significant family history of the same. Recently started supplement.  Will recheck labs next visit.      Relevant Medications   ferrous sulfate 300 (60 Fe) MG/5ML syrup   Generalized anxiety disorder   Refer to depression plan of care.      Relevant Medications   buPROPion  (WELLBUTRIN  XL) 300 MG 24 hr tablet   busPIRone (BUSPAR) 5 MG tablet   hydrOXYzine (VISTARIL) 25 MG capsule   Depression - Primary   Chronic, ongoing for some time.  Denies SI/HI.  Has a safety plan in place. Lives with her sisters. Educated her on options for treatment.  Will increase Wellbutrin  XL to 300 MG daily as is offering benefit to her anhedonia and depressive mood + does not have  side effect of weight gain, as she does not want to gain weight. Currently she has lost 13 lbs with Wellbutrin  on board. Start Buspar 5 MG BID for anxiety element and Vistaril PRN, educated her on this plan.  Educated her on medications and side effects + BLACK BOX warning.  She is aware if any suicidal ideation or plan presents to immediately stop medication and go to ER or alert PCP. Goal is to avoid SSRI and SNRI family due to risk for weight gain.      Relevant Medications   buPROPion  (WELLBUTRIN  XL) 300 MG 24 hr tablet   busPIRone (BUSPAR) 5 MG tablet   hydrOXYzine (VISTARIL) 25 MG capsule   Class 3 severe obesity due to excess calories without serious comorbidity with body mass index (BMI) of 60.0 to 69.9 in adult (HCC)   BMI 58.50, has lost 13 lbs with Wellbutrin . Has tried losing weight for >6 months and been unable to with healthy diet  changes and regular activity.  Has obesity in family, her sister had success from surgery in past. She has not heard from [facility] weight management, referral placed last visit.  Will check on this. Recommended eating smaller high protein, low fat meals more frequently and exercising 30 mins a day 5 times a week with a goal of 10-15lb weight loss in the next 3 months. Patient voiced their understanding and motivation to adhere to these recommendations.       BMI 60.0-69.9, adult [facility])   Refer to obesity plan of care.        Follow up plan: Return in about 6 weeks (around 09/11/2024) for Depression, ANXIETY.

## 2024-07-31 NOTE — Assessment & Plan Note (Signed)
 Refer to obesity plan of care. ?

## 2024-07-31 NOTE — Assessment & Plan Note (Signed)
 Chronic, ongoing for some time.  Denies SI/HI.  Has a safety plan in place. Lives with her sisters. Educated her on options for treatment.  Will increase Wellbutrin  XL to 300 MG daily as is offering benefit to her anhedonia and depressive mood + does not have side effect of weight gain, as she does not want to gain weight. Currently she has lost 13 lbs with Wellbutrin  on board. Start Buspar 5 MG BID for anxiety element and Vistaril PRN, educated her on this plan.  Educated her on medications and side effects + BLACK BOX warning.  She is aware if any suicidal ideation or plan presents to immediately stop medication and go to ER or alert PCP. Goal is to avoid SSRI and SNRI family due to risk for weight gain.

## 2024-09-15 ENCOUNTER — Ambulatory Visit: Admitting: Nurse Practitioner

## 2024-09-20 NOTE — Patient Instructions (Incomplete)
 Be Involved in Caring For Your Health:  Taking Medications When medications are taken as directed, they can greatly improve your health. But if they are not taken as prescribed, they may not work. In some cases, not taking them correctly can be harmful. To help ensure your treatment remains effective and safe, understand your medications and how to take them. Bring your medications to each visit for review by your provider.  Your lab results, notes, and after visit summary will be available on My Chart. We strongly encourage you to use this feature. If lab results are abnormal the clinic will contact you with the appropriate steps. If the clinic does not contact you assume the results are satisfactory. You can always view your results on My Chart. If you have questions regarding your health or results, please contact the clinic during office hours. You can also ask questions on My Chart.  We at [facility] are grateful that you chose us  to provide your care. We strive to provide evidence-based and compassionate care and are always looking for feedback. If you get a survey from the clinic please complete this so we can hear your opinions.  Managing Depression, Adult Depression is a mental health condition that affects your thoughts, feelings, and actions. Being diagnosed with depression can bring you relief if you did not know why you have felt or behaved a certain way. It could also leave you feeling overwhelmed. Finding ways to manage your symptoms can help you feel more positive about your future. How to manage lifestyle changes Being depressed is difficult. Depression can increase the level of everyday stress. Stress can make depression symptoms worse. You may believe your symptoms cannot be managed or will never improve. However, there are many things you can try to help manage your symptoms. There is hope. Managing stress  Stress is your body's reaction to life changes and events,  both good and bad. Stress can add to your feelings of depression. Learning to manage your stress can help lessen your feelings of depression. Try some of the following approaches to reducing your stress (stress reduction techniques): Listen to music that you enjoy and that inspires you. Try using a meditation app or take a meditation class. Develop a practice that helps you connect with your spiritual self. Walk in nature, pray, or go to a place of worship. Practice deep breathing. To do this, inhale slowly through your nose. Pause at the top of your inhale for a few seconds and then exhale slowly, letting yourself relax. Repeat this three or four times. Practice yoga to help relax and work your muscles. Choose a stress reduction technique that works for you. These techniques take time and practice to develop. Set aside 5-15 minutes a day to do them. Therapists can offer training in these techniques. Do these things to help manage stress: Keep a journal. Know your limits. Set healthy boundaries for yourself and others, such as saying no when you think something is too much. Pay attention to how you react to certain situations. You may not be able to control everything, but you can change your reaction. Add humor to your life by watching funny movies or shows. Make time for activities that you enjoy and that relax you. Spend less time using electronics, especially at night before bed. The light from screens can make your brain think it is time to get up rather than go to bed.  Medicines Medicines, such as antidepressants, are often a part of  treatment for depression. Talk with your pharmacist or health care provider about all the medicines, supplements, and herbal products that you take, their possible side effects, and what medicines and other products are safe to take together. Make sure to report any side effects you may have to your health care provider. Relationships Your health care  provider may suggest family therapy, couples therapy, or individual therapy as part of your treatment. How to recognize changes Everyone responds differently to treatment for depression. As you recover from depression, you may start to: Have more interest in doing activities. Feel more hopeful. Have more energy. Eat a more regular amount of food. Have better mental focus. It is important to recognize if your depression is not getting better or is getting worse. The symptoms you had in the beginning may return, such as: Feeling tired. Eating too much or too little. Sleeping too much or too little. Feeling restless, agitated, or hopeless. Trouble focusing or making decisions. Having unexplained aches and pains. Feeling irritable, angry, or aggressive. If you or your family members notice these symptoms coming back, let your health care provider know right away. Follow these instructions at home: Activity Try to get some form of exercise each day, such as walking. Try yoga, mindfulness, or other stress reduction techniques. Participate in group activities if you are able. Lifestyle Get enough sleep. Cut down on or stop using caffeine, tobacco, alcohol, and any other harmful substances. Eat a healthy diet that includes plenty of vegetables, fruits, whole grains, low-fat dairy products, and lean protein. Limit foods that are high in solid fats, added sugar, or salt (sodium). General instructions Take over-the-counter and prescription medicines only as told by your health care provider. Keep all follow-up visits. It is important for your health care provider to check on your mood, behavior, and medicines. Your health care provider may need to make changes to your treatment. Where to find support Talking to others  Friends and family members can be sources of support and guidance. Talk to trusted friends or family members about your condition. Explain your symptoms and let them know that you  are working with a health care provider to treat your depression. Tell friends and family how they can help. Finances Find mental health providers that fit with your financial situation. Talk with your health care provider if you are worried about access to food, housing, or medicine. Call your insurance company to learn about your co-pays and prescription plan. Where to find more information You can find support in your area from: Anxiety and Depression Association of America (ADAA): adaa.org Mental Health [place]: mentalhealthamerica.net [employer] on Mental Illness: nami.org Contact a health care provider if: You stop taking your antidepressant medicines, and you have any of these symptoms: Nausea. Headache. Light-headedness. Chills and body aches. Not being able to sleep (insomnia). You or your friends and family think your depression is getting worse. Get help right away if: You have thoughts of hurting yourself or others. Get help right away if you feel like you may hurt yourself or others, or have thoughts about taking your own life. Go to your nearest emergency room or: Call 911. Call the National Suicide Prevention Lifeline at (743) 630-7432 or 988. This is open 24 hours a day. Text the Crisis Text Line at 854-590-3828. This information is not intended to replace advice given to you by your health care provider. Make sure you discuss any questions you have with your health care provider. Document Revised: 02/13/2022 Document Reviewed:  02/13/2022 Elsevier Patient Education  2024 [employer].

## 2024-10-02 ENCOUNTER — Ambulatory Visit: Admitting: Nurse Practitioner
